# Patient Record
Sex: Male | Born: 1945 | Race: White | Hispanic: No | Marital: Married | Smoking: Current some day smoker
Health system: Southern US, Community
[De-identification: ages and names within clinical notes are randomized; demographics above are authoritative.]

## PROBLEM LIST (undated history)

## (undated) ENCOUNTER — Emergency Department (HOSPITAL_COMMUNITY): Admission: EM | Payer: PRIVATE HEALTH INSURANCE | Source: Home / Self Care

## (undated) DIAGNOSIS — S065XAA Traumatic subdural hemorrhage with loss of consciousness status unknown, initial encounter: Secondary | ICD-10-CM

## (undated) DIAGNOSIS — I10 Essential (primary) hypertension: Secondary | ICD-10-CM

## (undated) DIAGNOSIS — R079 Chest pain, unspecified: Secondary | ICD-10-CM

## (undated) DIAGNOSIS — N200 Calculus of kidney: Secondary | ICD-10-CM

## (undated) HISTORY — DX: Calculus of kidney: N20.0

## (undated) HISTORY — PX: BURR HOLE FOR SUBDURAL HEMATOMA: SHX1275

## (undated) HISTORY — DX: Essential (primary) hypertension: I10

---

## 2008-09-07 ENCOUNTER — Encounter (INDEPENDENT_AMBULATORY_CARE_PROVIDER_SITE_OTHER): Payer: Self-pay | Admitting: *Deleted

## 2008-12-20 ENCOUNTER — Ambulatory Visit: Payer: Self-pay | Admitting: Family Medicine

## 2009-04-27 ENCOUNTER — Telehealth (INDEPENDENT_AMBULATORY_CARE_PROVIDER_SITE_OTHER): Payer: Self-pay | Admitting: *Deleted

## 2010-01-01 ENCOUNTER — Ambulatory Visit: Payer: Self-pay | Admitting: Family Medicine

## 2010-01-20 ENCOUNTER — Emergency Department (HOSPITAL_COMMUNITY): Admission: EM | Admit: 2010-01-20 | Discharge: 2010-01-20 | Payer: Self-pay | Admitting: Emergency Medicine

## 2010-02-19 ENCOUNTER — Encounter: Admission: RE | Admit: 2010-02-19 | Discharge: 2010-02-19 | Payer: Self-pay | Admitting: Family Medicine

## 2010-04-25 NOTE — Progress Notes (Signed)
Summary: Schedule Colonoscopy  Phone Note Outgoing Call Call back at [facility] Phone [phone]   Call placed by: Hortense Ramal CMA Duncan Dull),  April 27, 2009 4:06 PM Call placed to: Patient Summary of Call: Patient is due for a recall colonoscopy due to his history of diverticulosis and hyperplastic colonic polyps. I have left a message on patient's voicemail to call back. Hortense Ramal CMA Duncan Dull)  April 27, 2009 4:09 PM   Follow-up for Phone Call        I have left a message for patient to return my call. Hortense Ramal CMA Duncan Dull)  May 03, 2009 3:50 PM     Additional Follow-up for Phone Call Additional follow up Details #3:: Details for Additional Follow-up Action Taken: Pt. returned call and refused to provide DOB or any info. to pull him up in the system. Per Karen Kitchens the reason she called the pt. was to schedule a colonoscopy. Pt. said if Dottie wanted to talk to him she knew his number that he was not providing anymore info. and he refused to sch. an appt. Additional Follow-up by: Karna Christmas,  May 04, 2009 9:35 AM

## 2010-06-05 LAB — POCT I-STAT, CHEM 8
Calcium, Ion: 1.15 mmol/L (ref 1.12–1.32)
Creatinine, Ser: 0.8 mg/dL (ref 0.4–1.5)
Glucose, Bld: 185 mg/dL — ABNORMAL HIGH (ref 70–99)
HCT: 47 % (ref 39.0–52.0)
Hemoglobin: 16 g/dL (ref 13.0–17.0)

## 2010-06-05 LAB — URINE MICROSCOPIC-ADD ON

## 2010-06-05 LAB — URINALYSIS, ROUTINE W REFLEX MICROSCOPIC
Bilirubin Urine: NEGATIVE
Ketones, ur: NEGATIVE mg/dL
Nitrite: NEGATIVE
Specific Gravity, Urine: 1.007 (ref 1.005–1.030)
pH: 6.5 (ref 5.0–8.0)

## 2011-01-16 ENCOUNTER — Encounter: Payer: Self-pay | Admitting: Family Medicine

## 2012-01-30 ENCOUNTER — Emergency Department (HOSPITAL_COMMUNITY)
Admission: EM | Admit: 2012-01-30 | Discharge: 2012-01-30 | Discharge status: Against Medical Advice | Payer: PRIVATE HEALTH INSURANCE | Attending: Emergency Medicine | Admitting: Emergency Medicine

## 2012-01-30 ENCOUNTER — Emergency Department (HOSPITAL_COMMUNITY): Payer: PRIVATE HEALTH INSURANCE

## 2012-01-30 ENCOUNTER — Encounter (HOSPITAL_COMMUNITY): Payer: Self-pay | Admitting: *Deleted

## 2012-01-30 ENCOUNTER — Other Ambulatory Visit: Payer: Self-pay

## 2012-01-30 DIAGNOSIS — Z7982 Long term (current) use of aspirin: Secondary | ICD-10-CM | POA: Insufficient documentation

## 2012-01-30 DIAGNOSIS — R079 Chest pain, unspecified: Secondary | ICD-10-CM | POA: Insufficient documentation

## 2012-01-30 DIAGNOSIS — Z87442 Personal history of urinary calculi: Secondary | ICD-10-CM | POA: Insufficient documentation

## 2012-01-30 DIAGNOSIS — N200 Calculus of kidney: Secondary | ICD-10-CM | POA: Insufficient documentation

## 2012-01-30 HISTORY — DX: Chest pain, unspecified: R07.9

## 2012-01-30 LAB — COMPREHENSIVE METABOLIC PANEL
ALT: 14 U/L (ref 0–53)
Alkaline Phosphatase: 79 U/L (ref 39–117)
BUN: 14 mg/dL (ref 6–23)
CO2: 20 mEq/L (ref 19–32)
Chloride: 107 mEq/L (ref 96–112)
GFR calc Af Amer: >90 mL/min (ref >90)
GFR calc non Af Amer: >90 mL/min (ref >90)
Glucose, Bld: 102 mg/dL — ABNORMAL HIGH (ref 70–99)
Potassium: 3.6 mEq/L (ref 3.5–5.1)
Sodium: 138 mEq/L (ref 135–145)
Total Bilirubin: 0.4 mg/dL (ref 0.3–1.2)
Total Protein: 6.6 g/dL (ref 6.0–8.3)

## 2012-01-30 LAB — TROPONIN I: Troponin I: <0.3 ng/mL (ref <0.30)

## 2012-01-30 LAB — CBC WITH DIFFERENTIAL/PLATELET
Hemoglobin: 15.4 g/dL (ref 13.0–17.0)
Lymphocytes Relative: 37 % (ref 12–46)
Lymphs Abs: 1.8 10*3/uL (ref 0.7–4.0)
MCH: 32.9 pg (ref 26.0–34.0)
Monocytes Relative: 11 % (ref 3–12)
Neutro Abs: 2.5 10*3/uL (ref 1.7–7.7)
Neutrophils Relative %: 51 % (ref 43–77)
Platelets: 174 10*3/uL (ref 150–400)
RBC: 4.68 MIL/uL (ref 4.22–5.81)
WBC: 4.9 10*3/uL (ref 4.0–10.5)

## 2012-01-30 NOTE — ED Provider Notes (Addendum)
History     CSN: [id number]  Arrival date & time 01/30/12  0128   First MD Initiated Contact with Patient 01/30/12 [phone]      Chief Complaint  Patient presents with  . Chest Pain   HPI  History provided by the patient and spouse. Patient is-year-old male with no significant PMH who presents with complaints of episodes of chest pain. Patient reports having occasional episodes of chest pain for the past 2 days. Patient is not reporting any associated aggravating or alleviating factors. Patient is not recall length of time chest pain is present. His morning patient awoke with chest discomfort around midnight. Symptoms were present for approximately 2-3 hours but were improving gradually during this time. Symptoms were not associated with any heart palpitations, shortness of breath, nausea or diaphoresis. Patient denies having any burning or sour taste in throat or stomach. He denies any belching. He does not radiate located primarily in the left chest. Pain is not worse with movements or position. Pain is not pleuritic.    Past Medical History  Diagnosis Date  . Calcium oxalate renal stones     History reviewed. No pertinent past surgical history.  No family history on file.  History  Substance Use Topics  . Smoking status: Never Smoker   . Smokeless tobacco: Not on file  . Alcohol Use:       Review of Systems  Constitutional: Negative for fever, chills and diaphoresis.  Respiratory: Negative for shortness of breath.   Cardiovascular: Positive for chest pain. Negative for palpitations.  Gastrointestinal: Negative for nausea, vomiting, abdominal pain and diarrhea.  Neurological: Negative for dizziness, syncope and light-headedness.    Allergies  Review of patient's allergies indicates no known allergies.  Home Medications   Current Outpatient Rx  Name  Route  Sig  Dispense  Refill  . ASPIRIN EC 81 MG PO TBEC   Oral   Take 81 mg by mouth once.         . IBUPROFEN  200 MG PO TABS   Oral   Take 400 mg by mouth every 6 (six) hours as needed. For pain         . NAPROXEN SODIUM 220 MG PO TABS   Oral   Take 220 mg by mouth 2 (two) times daily as needed. For pain           BP 167/89  Pulse 75  Temp 97.9 F (36.6 C) (Oral)  Resp 18  SpO2 100%  Physical Exam  Nursing note and vitals reviewed. Constitutional: He is oriented to person, place, and time. He appears well-developed and well-nourished. No distress.  HENT:  Head: Normocephalic.  Cardiovascular: Normal rate and regular rhythm.   No murmur heard. Pulmonary/Chest: Effort normal and breath sounds normal. No respiratory distress. He has no wheezes. He has no rales. He exhibits no tenderness.  Abdominal: Soft. There is no tenderness. There is no rebound and no guarding.  Neurological: He is alert and oriented to person, place, and time.  Skin: Skin is warm.  Psychiatric: He has a normal mood and affect. His behavior is normal.    ED Course  Procedures   Results for orders placed during the hospital encounter of 01/30/12  CBC WITH DIFFERENTIAL      Component Value Range   WBC 4.9  4.0 - 10.5 K/uL   RBC 4.68  4.22 - 5.81 MIL/uL   Hemoglobin 15.4  13.0 - 17.0 g/dL   HCT 56.2  13.0 -  52.0 %   MCV 89.1  78.0 - 100.0 fL   MCH 32.9  26.0 - 34.0 pg   MCHC 36.9 (*) 30.0 - 36.0 g/dL   RDW 16.1  09.6 - 04.5 %   Platelets 174  150 - 400 K/uL   Neutrophils Relative 51  43 - 77 %   Neutro Abs 2.5  1.7 - 7.7 K/uL   Lymphocytes Relative 37  12 - 46 %   Lymphs Abs 1.8  0.7 - 4.0 K/uL   Monocytes Relative 11  3 - 12 %   Monocytes Absolute 0.5  0.1 - 1.0 K/uL   Eosinophils Relative 1  0 - 5 %   Eosinophils Absolute 0.0  0.0 - 0.7 K/uL   Basophils Relative 0  0 - 1 %   Basophils Absolute 0.0  0.0 - 0.1 K/uL  COMPREHENSIVE METABOLIC PANEL      Component Value Range   Sodium 138  135 - 145 mEq/L   Potassium 3.6  3.5 - 5.1 mEq/L   Chloride 107  96 - 112 mEq/L   CO2 20  19 - 32 mEq/L    Glucose, Bld 102 (*) 70 - 99 mg/dL   BUN 14  6 - 23 mg/dL   Creatinine, Ser 4.09  0.50 - 1.35 mg/dL   Calcium 8.8  8.4 - 81.1 mg/dL   Total Protein 6.6  6.0 - 8.3 g/dL   Albumin 3.4 (*) 3.5 - 5.2 g/dL   AST 19  0 - 37 U/L   ALT 14  0 - 53 U/L   Alkaline Phosphatase 79  39 - 117 U/L   Total Bilirubin 0.4  0.3 - 1.2 mg/dL   GFR calc non Af Amer >90  >90 mL/min   GFR calc Af Amer >90  >90 mL/min  TROPONIN I      Component Value Range   Troponin I <0.30  <0.30 ng/mL  TROPONIN I      Component Value Range   Troponin I <0.30  <0.30 ng/mL       Dg Chest Portable 1 View  01/30/2012  *RADIOLOGY REPORT*  Clinical Data: Chest pain.  PORTABLE CHEST - 1 VIEW  Comparison: None.  Findings: The lungs are well-aerated and clear.  There is no evidence of focal opacification, pleural effusion or pneumothorax.  The cardiomediastinal silhouette is within normal limits.  No acute osseous abnormalities are seen.  IMPRESSION: No acute cardiopulmonary process seen.   Original Report Authenticated By: Tonia Ghent, M.D.      1. Chest pain       MDM  2:15AM patient seen and evaluated. Patient resting comfortably in no acute distress. Patient reports improvement of pain symptoms. Patient declined any medications or treatment.  Patient with unremarkable tests, chest x-ray and EKG at this time. Pt discussed with Attending Physician.  At this time will recommend admission for CP rule out.  Patient does not wish to stay in the hospital. The risks benefits and alternatives were explained to patient and he expressed understanding. He was to return home and followup with primary care provider for continued evaluation and treatment     Date: 01/30/2012  Rate: 74  Rhythm: normal sinus rhythm  QRS Axis: normal  Intervals: normal  ST/T Wave abnormalities: normal  Conduction Disutrbances:none  Narrative Interpretation: PVC  Old EKG Reviewed: none available      Angus Seller, PA 01/30/12  0532  Angus Seller, PA 01/30/12 [phone]

## 2012-01-30 NOTE — ED Notes (Signed)
Pt c/o left sided chest pain intermittently for several days; tonight it woke him up from sleeping; states this pain is different; c/o left arm pain; denies n/v; denies diaphoresis; denies sob;

## 2012-01-30 NOTE — ED Provider Notes (Signed)
Medical screening examination/treatment/procedure(s) were performed by non-physician practitioner and as supervising physician I was immediately available for consultation/collaboration.  Sunnie Nielsen, MD 01/30/12 [phone]

## 2012-01-30 NOTE — ED Provider Notes (Signed)
Medical screening examination/treatment/procedure(s) were conducted as a shared visit with non-physician practitioner(s) and myself.  I personally evaluated the patient during the encounter   Isaac Nielsen, MD 01/30/12 2314

## 2012-02-02 ENCOUNTER — Ambulatory Visit (INDEPENDENT_AMBULATORY_CARE_PROVIDER_SITE_OTHER): Payer: Medicare Other | Admitting: Cardiovascular Disease

## 2012-02-02 ENCOUNTER — Encounter: Payer: Self-pay | Admitting: Cardiovascular Disease

## 2012-02-02 VITALS — BP 164/90 | HR 71 | Wt 214.0 lb

## 2012-02-02 DIAGNOSIS — R079 Chest pain, unspecified: Secondary | ICD-10-CM

## 2012-02-02 NOTE — Progress Notes (Signed)
Patient ID: Isaac Strong, male   DOB: 15-Nov-1945, 66 y.o.   MRN: [id number] 66 yo seen in ER 11/8 for chest pain.  R/O normal ECG normal CXR and did not want to stay for CDU w/u as he would be compliant with quick outpatient cardiac f/U.  Pain still intermitant and lingering.  No GI overtones.  Mild Can wake up from sleep with it Sometimes better with shallow breath but no pleurisy.  No cough or fever No muscular component Pain is not intense and not exertional.  Smokes cigars.  Recently lost 40 lbs but has some chronic foot issues that limits hiking and other exercises.    ROS: Denies fever, malais, weight loss, blurry vision, decreased visual acuity, cough, sputum, SOB, hemoptysis, pleuritic pain, palpitaitons, heartburn, abdominal pain, melena, lower extremity edema, claudication, or rash.  All other systems reviewed and negative   General: Affect appropriate Healthy:  appears stated age HEENT: normal Neck supple with no adenopathy JVP normal no bruits no thyromegaly Lungs clear with no wheezing and good diaphragmatic motion Heart:  S1/S2 no murmur,rub, gallop or click PMI normal Abdomen: benighn, BS positve, no tenderness, no AAA no bruit.  No HSM or HJR Distal pulses intact with no bruits No edema Neuro non-focal Skin warm and dry No muscular weakness  Medications Current Outpatient Prescriptions  Medication Sig Dispense Refill  . aspirin EC 81 MG tablet Take 81 mg by mouth as needed.         Allergies Review of patient's allergies indicates no known allergies.  Family History: No family history on file.  Social History: History   Social History  . Marital Status: Married    Spouse Name: N/A    Number of Children: N/A  . Years of Education: N/A   Occupational History  . Not on file.   Social History Main Topics  . Smoking status: Never Smoker   . Smokeless tobacco: Not on file  . Alcohol Use:   . Drug Use:   . Sexually Active:    Other Topics Concern  .  Not on file   Social History Narrative  . No narrative on file    Electrocardiogram:  01/30/12  NSR normal ECG   Assessment and Plan

## 2012-02-02 NOTE — Patient Instructions (Signed)
Your physician recommends that you schedule a follow-up appointment in: AS NEEDED Your physician recommends that you continue on your current medications as directed. Please refer to the Current Medication list given to you today.  Your physician has requested that you have an echocardiogram. Echocardiography is a painless test that uses sound waves to create images of your heart. It provides your doctor with information about the size and shape of your heart and how well your heart's chambers and valves are working. This procedure takes approximately one hour. There are no restrictions for this procedure. DX CHEST PAIN  Your physician has requested that you have an exercise tolerance test. For further information please visit [email or web address] Please also follow instruction sheet, as given. DX CHEST PAIN

## 2012-02-02 NOTE — Assessment & Plan Note (Signed)
Atypical but persistant and necessitating ER visit  F/U ETT  F/U echo to r/o pericardial disease

## 2012-02-06 ENCOUNTER — Ambulatory Visit (HOSPITAL_COMMUNITY): Payer: PRIVATE HEALTH INSURANCE | Attending: Cardiovascular Disease | Admitting: Radiology

## 2012-02-06 DIAGNOSIS — R072 Precordial pain: Secondary | ICD-10-CM | POA: Insufficient documentation

## 2012-02-06 DIAGNOSIS — R079 Chest pain, unspecified: Secondary | ICD-10-CM

## 2012-02-06 NOTE — Progress Notes (Signed)
Echocardiogram performed.  

## 2012-02-09 ENCOUNTER — Telehealth: Payer: Self-pay | Admitting: Cardiovascular Disease

## 2012-02-09 NOTE — Telephone Encounter (Signed)
PT AWARE OF ECHO RESULTS./CY 

## 2012-02-09 NOTE — Telephone Encounter (Signed)
Pt rtn your call

## 2012-02-16 ENCOUNTER — Ambulatory Visit (INDEPENDENT_AMBULATORY_CARE_PROVIDER_SITE_OTHER): Payer: Medicare Other | Admitting: Physician Assistant

## 2012-02-16 DIAGNOSIS — R079 Chest pain, unspecified: Secondary | ICD-10-CM

## 2012-02-16 NOTE — Progress Notes (Signed)
Exercise Treadmill Test  Pre-Exercise Testing Evaluation Rhythm:83 normal sinus  Rate: 83   PR:  40 QRS:  47  QT:  .40 QTc: .47     Test  Exercise Tolerance Test Ordering MD: Charlton Haws, MD  Interpreting MD: Tereso Newcomer PA  Unique Test No:1 Treadmill:  1  Indication for ETT: chest pain - rule out ischemia  Contraindication to ETT: No   Stress Modality: exercise - treadmill  Cardiac Imaging Performed: non   Protocol: standard Bruce - maximal  Max BP:  201/86  Max MPHR (bpm):  155 85% MPR (bpm):  132  MPHR obtained (bpm): 151  % MPHR obtained:  96%  Reached 85% MPHR (min:sec):  5:10 Total Exercise Time (min-sec):  7:06  Workload in METS:  8.7 Borg Scale: 16  Reason ETT Terminated:  desired heart rate attained    ST Segment Analysis At Rest: normal ST segments - no evidence of significant ST depression With Exercise: no evidence of significant ST depression  Other Information Arrhythmia:  No Angina during ETT:  absent (0) Quality of ETT:  diagnostic  ETT Interpretation:  normal - no evidence of ischemia by ST analysis  Comments: Fair exercise tolerance. No chest pain. Normal BP response to exercise. No ST-T changes to suggest ischemia.  Occasional PVCs noted during exercise.  Recommendations: Follow up with Dr. Charlton Haws as directed. Signed,  Tereso Newcomer, PA-C  4:13 PM 02/16/2012

## 2012-02-17 NOTE — Progress Notes (Signed)
NOTED ./CY 

## 2019-01-04 ENCOUNTER — Other Ambulatory Visit: Payer: Self-pay

## 2019-01-04 DIAGNOSIS — Z20822 Contact with and (suspected) exposure to covid-19: Secondary | ICD-10-CM

## 2019-01-06 LAB — NOVEL CORONAVIRUS, NAA: SARS-CoV-2, NAA: NOT DETECTED

## 2019-01-28 ENCOUNTER — Other Ambulatory Visit: Payer: Self-pay

## 2019-01-28 DIAGNOSIS — Z20822 Contact with and (suspected) exposure to covid-19: Secondary | ICD-10-CM

## 2019-01-29 LAB — NOVEL CORONAVIRUS, NAA: SARS-CoV-2, NAA: NOT DETECTED

## 2019-03-07 ENCOUNTER — Other Ambulatory Visit: Payer: Self-pay

## 2019-03-07 DIAGNOSIS — Z20822 Contact with and (suspected) exposure to covid-19: Secondary | ICD-10-CM

## 2019-03-08 LAB — NOVEL CORONAVIRUS, NAA: SARS-CoV-2, NAA: NOT DETECTED

## 2019-04-24 ENCOUNTER — Ambulatory Visit: Payer: PRIVATE HEALTH INSURANCE

## 2019-04-29 ENCOUNTER — Ambulatory Visit: Payer: PRIVATE HEALTH INSURANCE

## 2019-05-05 ENCOUNTER — Ambulatory Visit: Payer: PRIVATE HEALTH INSURANCE

## 2021-06-12 ENCOUNTER — Other Ambulatory Visit: Payer: Self-pay

## 2021-06-12 ENCOUNTER — Encounter (HOSPITAL_BASED_OUTPATIENT_CLINIC_OR_DEPARTMENT_OTHER): Payer: Self-pay

## 2021-06-12 ENCOUNTER — Emergency Department (HOSPITAL_BASED_OUTPATIENT_CLINIC_OR_DEPARTMENT_OTHER)
Admission: EM | Admit: 2021-06-12 | Discharge: 2021-06-12 | Disposition: A | Discharge status: Home / Self Care | Payer: No Typology Code available for payment source | Attending: Emergency Medicine | Admitting: Emergency Medicine

## 2021-06-12 ENCOUNTER — Emergency Department (HOSPITAL_BASED_OUTPATIENT_CLINIC_OR_DEPARTMENT_OTHER): Payer: No Typology Code available for payment source

## 2021-06-12 DIAGNOSIS — G8929 Other chronic pain: Secondary | ICD-10-CM | POA: Diagnosis not present

## 2021-06-12 DIAGNOSIS — M25521 Pain in right elbow: Secondary | ICD-10-CM | POA: Insufficient documentation

## 2021-06-12 DIAGNOSIS — Z23 Encounter for immunization: Secondary | ICD-10-CM | POA: Insufficient documentation

## 2021-06-12 DIAGNOSIS — Z7982 Long term (current) use of aspirin: Secondary | ICD-10-CM | POA: Diagnosis not present

## 2021-06-12 DIAGNOSIS — S161XXA Strain of muscle, fascia and tendon at neck level, initial encounter: Secondary | ICD-10-CM | POA: Diagnosis not present

## 2021-06-12 DIAGNOSIS — S0181XA Laceration without foreign body of other part of head, initial encounter: Secondary | ICD-10-CM | POA: Insufficient documentation

## 2021-06-12 DIAGNOSIS — Z79899 Other long term (current) drug therapy: Secondary | ICD-10-CM | POA: Diagnosis not present

## 2021-06-12 DIAGNOSIS — W108XXA Fall (on) (from) other stairs and steps, initial encounter: Secondary | ICD-10-CM

## 2021-06-12 DIAGNOSIS — F17210 Nicotine dependence, cigarettes, uncomplicated: Secondary | ICD-10-CM | POA: Insufficient documentation

## 2021-06-12 DIAGNOSIS — S0990XA Unspecified injury of head, initial encounter: Secondary | ICD-10-CM | POA: Diagnosis present

## 2021-06-12 DIAGNOSIS — M25561 Pain in right knee: Secondary | ICD-10-CM | POA: Insufficient documentation

## 2021-06-12 DIAGNOSIS — S0083XA Contusion of other part of head, initial encounter: Secondary | ICD-10-CM

## 2021-06-12 DIAGNOSIS — W01198A Fall on same level from slipping, tripping and stumbling with subsequent striking against other object, initial encounter: Secondary | ICD-10-CM | POA: Diagnosis not present

## 2021-06-12 HISTORY — DX: Traumatic subdural hemorrhage with loss of consciousness status unknown, initial encounter: S06.5XAA

## 2021-06-12 MED ORDER — TETANUS-DIPHTH-ACELL PERTUSSIS 5-2.5-18.5 LF-MCG/0.5 IM SUSY
0.5000 mL | PREFILLED_SYRINGE | Freq: Once | INTRAMUSCULAR | Status: AC
  Administered 2021-06-12: 0.5 mL via INTRAMUSCULAR
  Filled 2021-06-12: qty 0.5

## 2021-06-12 MED ORDER — CYCLOBENZAPRINE HCL 5 MG PO TABS
5.0000 mg | ORAL_TABLET | Freq: Once | ORAL | Status: AC
  Administered 2021-06-12: 5 mg via ORAL
  Filled 2021-06-12: qty 1

## 2021-06-12 MED ORDER — CYCLOBENZAPRINE HCL 5 MG PO TABS: 5.0000 mg | ORAL_TABLET | Freq: Three times a day (TID) | ORAL | 0 refills | Status: DC | PRN

## 2021-06-12 MED ORDER — ACETAMINOPHEN 500 MG PO TABS: 1000.0000 mg | ORAL_TABLET | Freq: Once | ORAL | Status: DC

## 2021-06-12 NOTE — ED Triage Notes (Signed)
Mechanical fall this AM just prior to arrival, struck R brow on step. C/o laceration and hematoma to R brow, R arm pain, R knee pain. Not on thinners. EDP at bedside. No LOC, denies vision change or dizziness.  ?

## 2021-06-12 NOTE — ED Provider Notes (Signed)
? ?[facility] ?Provider Note: [name], MD, FACEP ? ?CSN: [id number] ?MRN: [id number] ?ARRIVAL: 06/12/21 at [facility] ?ROOM: [id number] ? ? ?CHIEF COMPLAINT  ?Fall ? ? ?HISTORY OF PRESENT ILLNESS  ?06/12/21 5:05 AM ?Isaac Strong is a 76 y.o. male who fell down the stairs at his house just prior to arrival.  He struck his forehead and has a hematoma above his right eye with associated bleeding.  He did not lose consciousness.  He has had no change in vision.  He has not been vomiting.  He denies neck pain.  He has some mild right elbow and right knee pain but states the right knee pain is chronic.  His tetanus is not up-to-date.  He rates the pain in his right forehead as an 8 out of 10, worse with palpation.  He is not on anticoagulation apart from low-dose aspirin. ? ? ?Past Medical History:  ?Diagnosis Date  ? Calcium oxalate renal stones   ? Chest pain   ? Subdural hematoma   ? ? ?Past Surgical History:  ?Procedure Laterality Date  ? BURR HOLE FOR SUBDURAL HEMATOMA    ? ? ?No family history on file. ? ?Social History  ? ?Tobacco Use  ? Smoking status: Some Days  ?  Types: Cigars  ?Substance Use Topics  ? Alcohol use: Yes  ? Drug use: Never  ? ? ?Prior to Admission medications   ?Medication Sig Start Date End Date Taking? Authorizing Provider  ?cyclobenzaprine (FLEXERIL) 5 MG tablet Take 1-2 tablets (5-10 mg total) by mouth 3 (three) times daily as needed for muscle spasms. 06/12/21  Yes Virgilia Quigg, MD  ?metoprolol succinate (TOPROL-XL) 25 MG 24 hr tablet TAKE 1 TABLET BY MOUTH EVERY DAY FOR BLOOD PRESSURE CONTROL 11/06/20  Yes [provider]  ?aspirin EC 81 MG tablet Take 81 mg by mouth as needed.     [provider]  ?losartan (COZAAR) 100 MG tablet Take by mouth.    [provider]  ? ? ?Allergies ?Patient has no known allergies. ? ? ?REVIEW OF SYSTEMS  ?Negative except as noted here or in the History of Present Illness. ? ? ?PHYSICAL EXAMINATION  ?Initial Vital  Signs ?Blood pressure 120/77, pulse 70, temperature 98.8 ?F (37.1 ?C), temperature source Oral, resp. rate 18, height 5\' 9"  (1.753 m), weight 78 kg, SpO2 100 %. ? ?Examination ?General: Well-developed, well-nourished male in no acute distress; appearance consistent with age of record ?HENT: normocephalic; no hemotympanums; tender hematoma right forehead with superficial laceration: ? ? ? ?Eyes: pupils equal, round and reactive to light; extraocular muscles intact; no hyphema; no subconjunctival hemorrhage ?Neck: supple; nontender ?Heart: regular rate and rhythm ?Lungs: clear to auscultation bilaterally ?Abdomen: soft; nondistended; nontender; ibowel sounds present ?Extremities: No deformity; full range of motion; pulses normal ?Neurologic: Awake, alert and oriented; motor function intact in all extremities and symmetric; no facial droop ?Skin: Warm and dry ?Psychiatric: Normal mood and affect ? ? ?RESULTS  ?Summary of this visit's results, reviewed and interpreted by myself: ? ? EKG Interpretation ? ?Date/Time:    ?Ventricular Rate:    ?PR Interval:    ?QRS Duration:   ?QT Interval:    ?QTC Calculation:   ?R Axis:     ?Text Interpretation:   ?  ? ?  ? ?Laboratory Studies: ?No results found for this or any previous visit (from the past 24 hour(s)). ?Imaging Studies: ?CT Head Wo Contrast ? ?Result Date: 06/12/2021 ?CLINICAL DATA:  Head trauma with  intracranial injury suspected EXAM: CT HEAD WITHOUT CONTRAST TECHNIQUE: Contiguous axial images were obtained from the base of the skull through the vertex without intravenous contrast. RADIATION DOSE REDUCTION: This exam was performed according to the departmental dose-optimization program which includes automated exposure control, adjustment of the mA and/or kV according to patient size and/or use of iterative reconstruction technique. COMPARISON:  None. FINDINGS: Brain: No evidence of acute infarction, hemorrhage, hydrocephalus, extra-axial collection or mass lesion/mass  effect. Age congruent brain volume and white matter appearance Vascular: No hyperdense vessel or unexpected calcification. Skull: Small remote high right craniotomies. No acute finding such as calvarial fracture. Right forehead hematoma. Sinuses/Orbits: No evidence of postseptal injury IMPRESSION: 1. No evidence of intracranial injury. 2. Right forehead hematoma. Electronically Signed   By: Jorje Guild M.D.   On: 06/12/2021 05:27   ? ?ED COURSE and MDM  ?Nursing notes, initial and subsequent vitals signs, including pulse oximetry, reviewed and interpreted by myself. ? ?Vitals:  ? 06/12/21 0500 06/12/21 0504  ?BP: 120/77   ?Pulse: 70   ?Resp: 18   ?Temp: 98.8 ?F (37.1 ?C)   ?TempSrc: Oral   ?SpO2: 100%   ?Weight:  78 kg  ?Height:  5\' 9"  (1.753 m)  ? ?Medications  ?cyclobenzaprine (FLEXERIL) tablet 5 mg (has no administration in time range)  ?acetaminophen (TYLENOL) tablet 1,000 mg (has no administration in time range)  ?Tdap (BOOSTRIX) injection 0.5 mL (0.5 mLs Intramuscular Given 06/12/21 0519)  ? ?There is a superficial laceration to the patient's right forehead overlying the hematoma.  It is less than 1/4 inch in length and I do not believe primary closure is indicated.  The patient was advised that the hematoma will likely be pulled downward by gravity leading to further periorbital edema and ecchymosis.  The patient is now having some mild right-sided neck muscular pain but no C-spine tenderness on palpation or passive movement of cervical spine. ? ? ?PROCEDURES  ?Procedures ? ? ?ED DIAGNOSES  ? ?  ICD-10-CM   ?1. Traumatic hematoma of forehead, initial encounter  S00.83XA   ?  ?2. Fall down stairs, initial encounter  W10.8XXA   ?  ?3. Laceration of skin of forehead, initial encounter  S01.81XA   ?  ?4. Cervical strain, acute, initial encounter  S16.1XXA   ?  ? ? ? ?  ?Shanon Rosser, MD ?06/12/21 [phone] ? ?

## 2023-09-20 IMAGING — CT CT HEAD W/O CM
4 series · 17 of 47 positions shown, 19 images · non-contrast
Comparison: None.

CLINICAL DATA: Head trauma with intracranial injury suspected



[Series 2: head wo · axial · 0.43mm/px · z∈[-376,-251]mm · 7 of 35 slices shown, 9 images]
[im 5/35  brain]
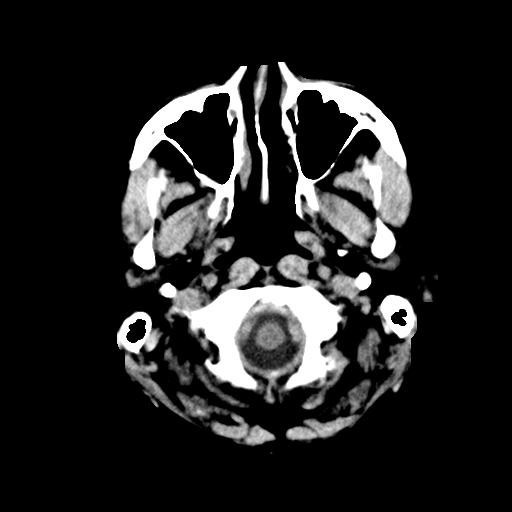
[im 5/35  bone]
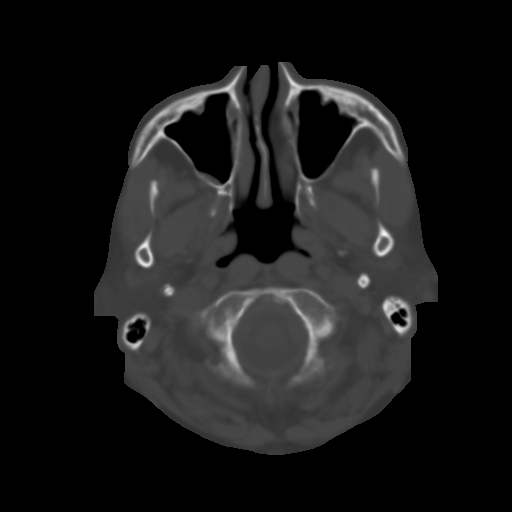
[im 9/35  brain]
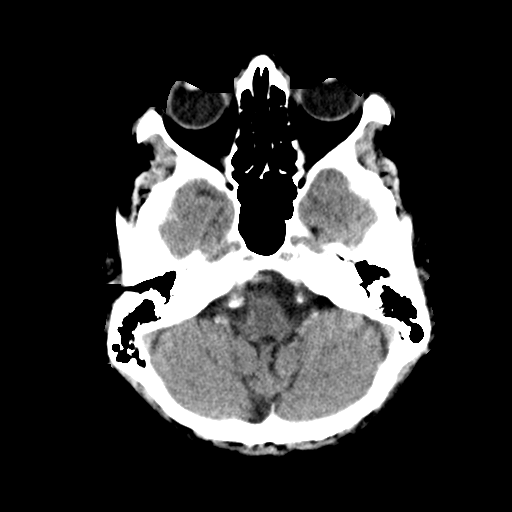
[im 13/35  brain]
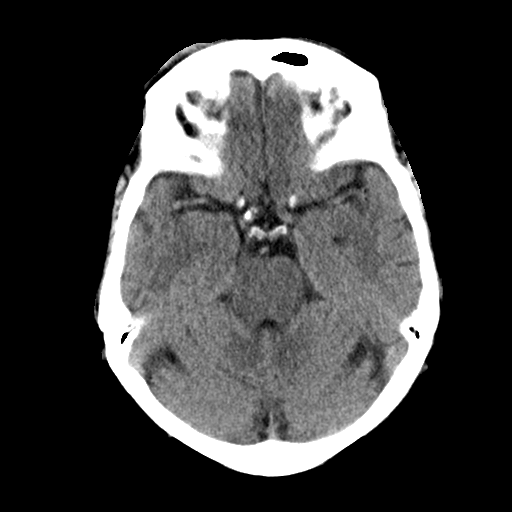
[im 18/35  brain]
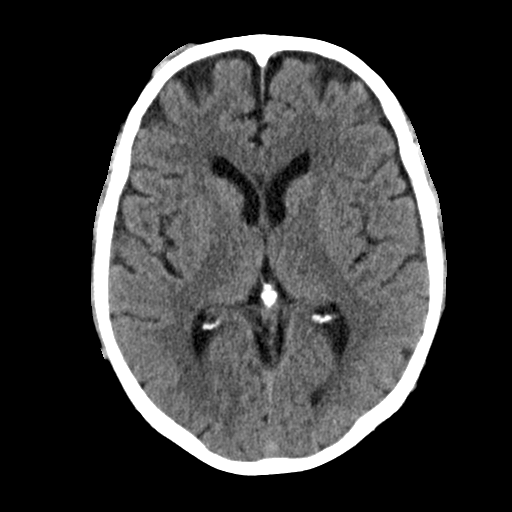
[im 22/35  brain]
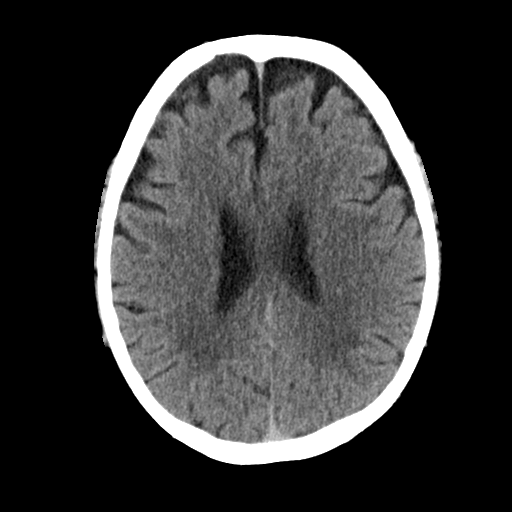
[im 22/35  bone]
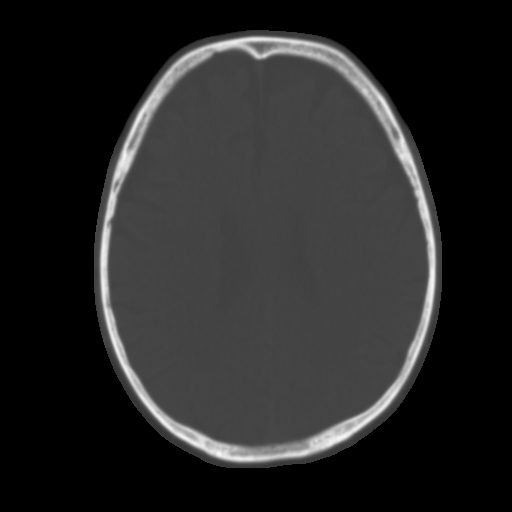
[im 26/35  brain]
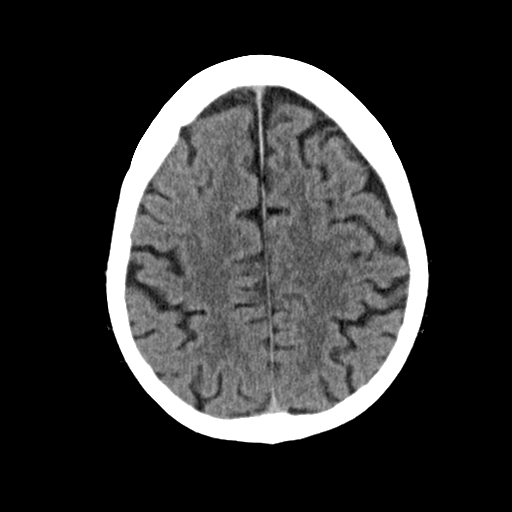
[im 30/35  brain]
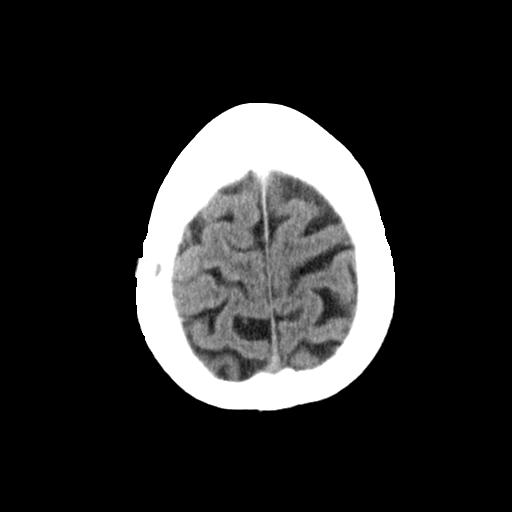

[Series 3: head bone · axial · 0.43mm/px · z∈[-380,-320]mm · 4 of 87 slices shown]
[im 9/87  bone]
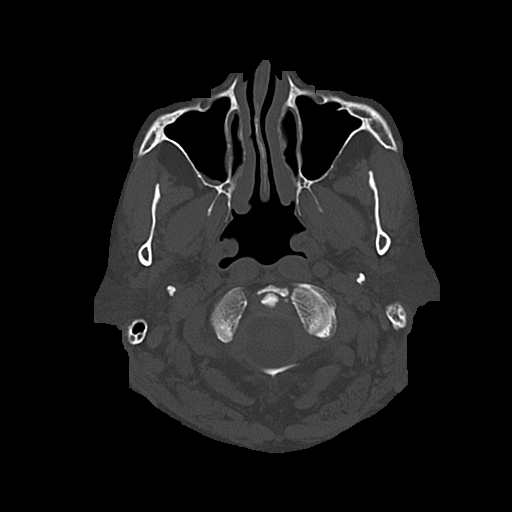
[im 18/87  bone]
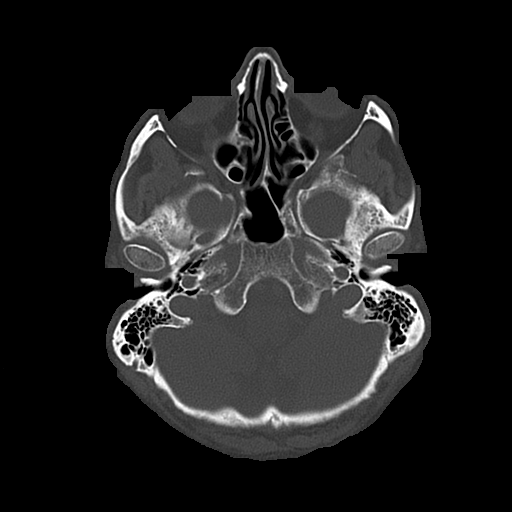
[im 26/87  bone]
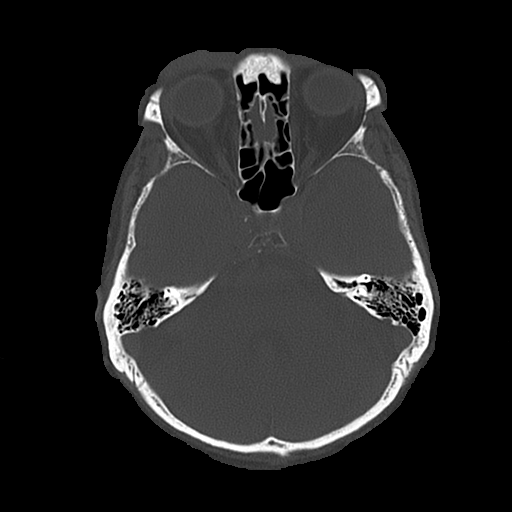
[im 39/87  bone]
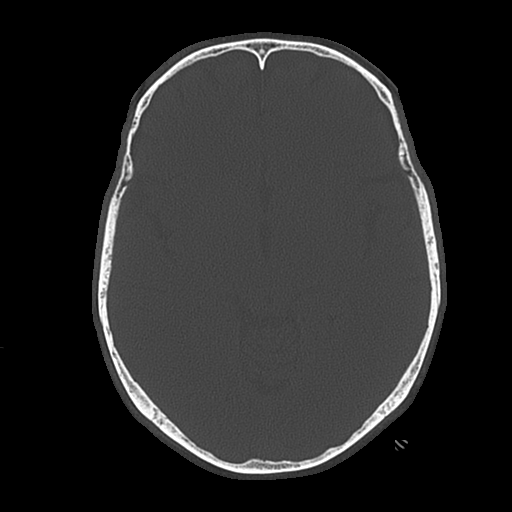

[Series 4: coronal soft · coronal · 0.37mm/px · 3 of 70 slices shown]
[im 24/70  brain]
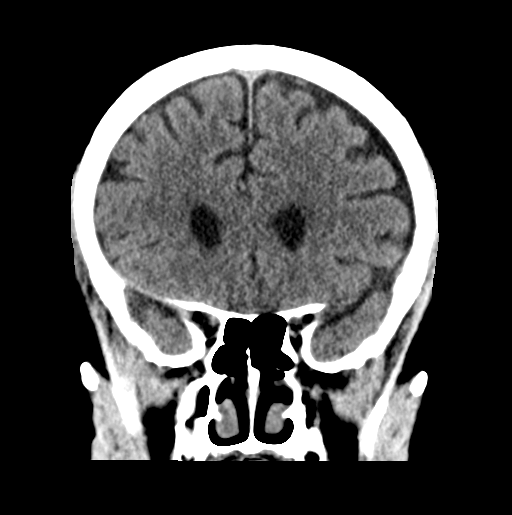
[im 31/70  brain]
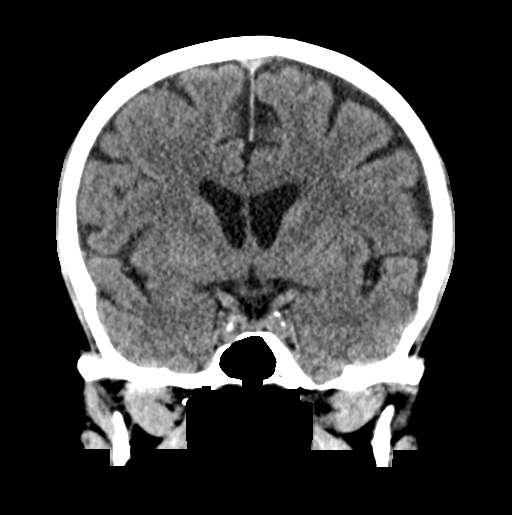
[im 39/70  brain]
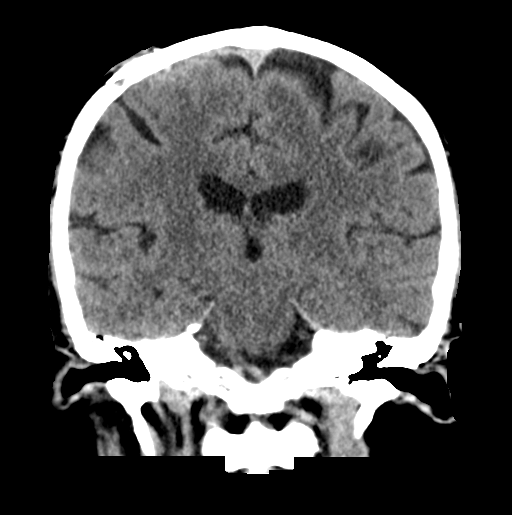

[Series 5: sagittal soft · sagittal · 0.37mm/px · 3 of 63 slices shown]
[im 21/63  brain]
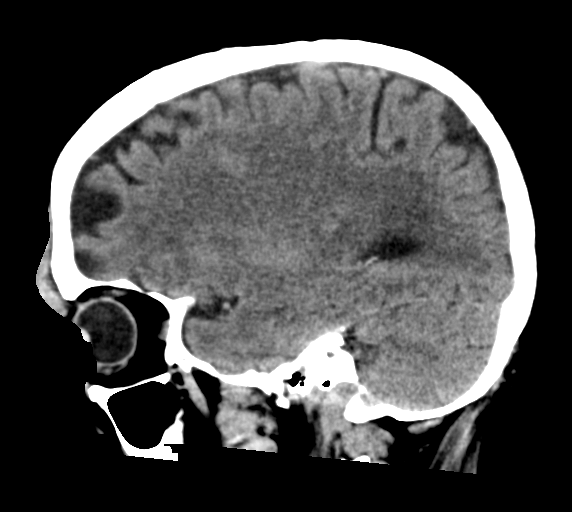
[im 32/63  brain]
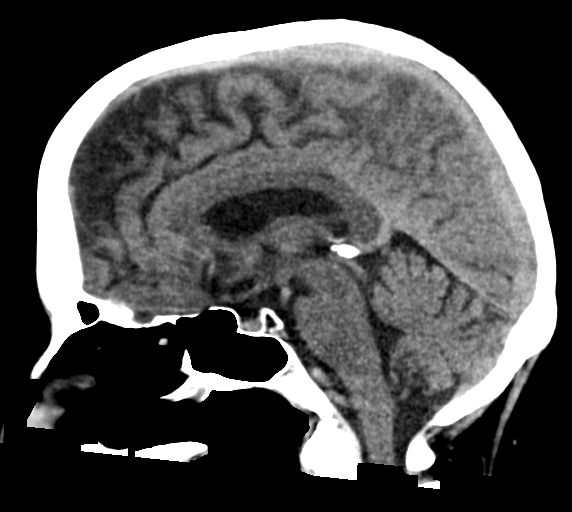
[im 42/63  brain]
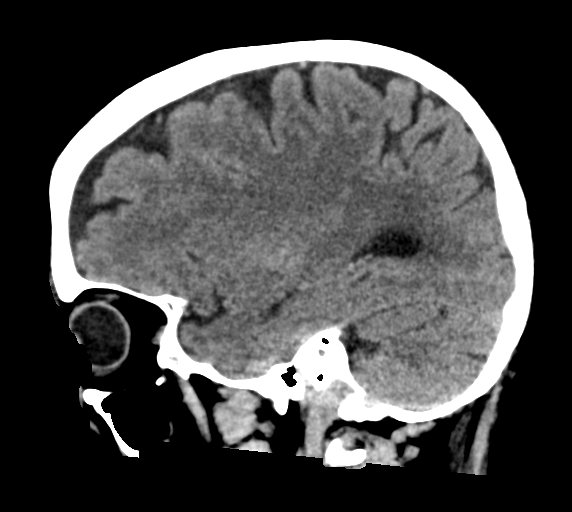

[17 of 47 positions shown; findings below may reference images not displayed]

FINDINGS: Brain: No evidence of acute infarction, hemorrhage, hydrocephalus,
extra-axial collection or mass lesion/mass effect. Age congruent
brain volume and white matter appearance

Vascular: No hyperdense vessel or unexpected calcification.

Skull: Small remote high right craniotomies. No acute finding such
as calvarial fracture. Right forehead hematoma.

Sinuses/Orbits: No evidence of postseptal injury
IMPRESSION: 1. No evidence of intracranial injury.
2. Right forehead hematoma.

## 2023-10-30 ENCOUNTER — Ambulatory Visit: Admitting: Physician Assistant

## 2023-12-14 NOTE — Progress Notes (Unsigned)
    Cardiology Office Note Date:  12/18/2023  ID:  Isaac Strong, DOB Dec 11, 1945, MRN [id number] PCP:  Tanda Prentice DEL, MD  Cardiologist: Joelle DEL Ren Donley, MD  Chief Complaint  Patient presents with   Chest Pain    History of Present Illness: Isaac Strong is a 78 y.o. male who presents for CP.   He reports that over the last few weeks, he has had 4-5 episodes of CP. It is substernal, nonexertional, nonpleuritic and last for up to 5 mins. He denies any associated dyspnea despite walking 1-2 miles every day. He denies orthopnea, PND, or LE edema.   ROS: Please see the history of present illness. All other systems are reviewed and negative.   Past Medical History:  Diagnosis Date   Calcium oxalate renal stones    Chest pain    HTN (hypertension)    Subdural hematoma (HCC)     Past Surgical History:  Procedure Laterality Date   BURR HOLE FOR SUBDURAL HEMATOMA      Current Outpatient Medications  Medication Sig Dispense Refill   Cyanocobalamin (VITAMIN B12 PO) Take by mouth.     losartan (COZAAR) 100 MG tablet Take by mouth.     metoprolol succinate (TOPROL-XL) 25 MG 24 hr tablet TAKE 1 TABLET BY MOUTH EVERY DAY FOR BLOOD PRESSURE CONTROL     No current facility-administered medications for this visit.    Allergies:   Patient has no known allergies.   Social History:  Smokes cigar  Family History:  Noncontributory  PHYSICAL EXAM: VS:  BP 118/62   Pulse (!) 54   Ht 5' 9 (1.753 m)   Wt 182 lb 6.4 oz (82.7 kg)   SpO2 98%   BMI 26.94 kg/m  , BMI Body mass index is 26.94 kg/m. GEN: Well nourished, well developed, in no acute distress HEENT: normal Neck: no JVD, carotid bruits, or masses Cardiac: Bradycardic; no murmurs, rubs, or gallops,no edema  Respiratory:  CTAB bilaterally, normal work of breathing GI: soft, nontender, nondistended, + BS Extremities: No LE edema Skin: warm and dry, no rash Neuro:  Strength and sensation are intact  EKG: SB w/ 1st  degree AVB  Recent Labs: Reviewed  Studies: Reviewed  ASSESSMENT AND PLAN: Isaac Strong is a 78 y.o. male who presents for CP.   #CP #HTN - Presenting with 4-5 episodes of CP that were short-lasting and nonexertional. His pain sounds non-cardiac. I discussed with the patient that coronary CTA would be appropriate, but he would prefer to defer for now. Given low likelihood of CP being of cardiac etiology, I think it is reasonable to monitor and re-assess if the pain becomes exertional or increase in frequency.  - BP controlled today and HR at 54; given SBP at 118, I think it is reasonable to stop metoprolol XL to prevent potential complications associated with bradycardia such as syncope and fall.    Signed, Joelle DEL Ren Donley, MD  12/18/2023 8:46 AM    [facility]

## 2023-12-18 ENCOUNTER — Ambulatory Visit

## 2023-12-18 VITALS — BP 118/62 | HR 54 | Ht 69.0 in | Wt 182.4 lb

## 2023-12-18 DIAGNOSIS — R0789 Other chest pain: Secondary | ICD-10-CM | POA: Insufficient documentation

## 2023-12-18 DIAGNOSIS — R072 Precordial pain: Secondary | ICD-10-CM | POA: Diagnosis present

## 2023-12-18 DIAGNOSIS — I10 Essential (primary) hypertension: Secondary | ICD-10-CM | POA: Diagnosis present

## 2023-12-18 NOTE — Patient Instructions (Signed)
 Medication Instructions:  Please STOP taking Toprol XL.  *If you need a refill on your cardiac medications before your next appointment, please call your pharmacy*  Lab Work: None.  If you have labs (blood work) drawn today and your tests are completely normal, you will receive your results only by: MyChart Message (if you have MyChart) OR A paper copy in the mail If you have any lab test that is abnormal or we need to change your treatment, we will call you to review the results.  Testing/Procedures: None.  Follow-Up: At [facility], you and your health needs are our priority.  As part of our continuing mission to provide you with exceptional heart care, our providers are all part of one team.  This team includes your primary Cardiologist (physician) and Advanced Practice Providers or APPs (Physician Assistants and Nurse Practitioners) who all work together to provide you with the care you need, when you need it.  Your next appointment will be as needed and it will be with:     Provider:   Dr. PHEBE Sites, MD
